# Patient Record
Sex: Female | Born: 1980 | Race: Black or African American | Hispanic: No | Marital: Single | Smoking: Never smoker
Health system: Southern US, Community
[De-identification: ages and names within clinical notes are randomized; demographics above are authoritative.]

## PROBLEM LIST (undated history)

## (undated) DIAGNOSIS — M797 Fibromyalgia: Secondary | ICD-10-CM

## (undated) DIAGNOSIS — I10 Essential (primary) hypertension: Secondary | ICD-10-CM

## (undated) DIAGNOSIS — E119 Type 2 diabetes mellitus without complications: Secondary | ICD-10-CM

---

## 2018-03-19 DIAGNOSIS — L03011 Cellulitis of right finger: Secondary | ICD-10-CM

## 2018-03-19 DIAGNOSIS — I639 Cerebral infarction, unspecified: Secondary | ICD-10-CM

## 2018-03-20 DIAGNOSIS — I639 Cerebral infarction, unspecified: Secondary | ICD-10-CM | POA: Diagnosis not present

## 2018-03-20 DIAGNOSIS — L03011 Cellulitis of right finger: Secondary | ICD-10-CM | POA: Diagnosis not present

## 2018-03-20 DIAGNOSIS — I6789 Other cerebrovascular disease: Secondary | ICD-10-CM

## 2018-03-31 ENCOUNTER — Other Ambulatory Visit: Payer: Self-pay

## 2018-03-31 ENCOUNTER — Encounter (HOSPITAL_COMMUNITY): Payer: Self-pay | Admitting: General Practice

## 2018-03-31 NOTE — Progress Notes (Signed)
Pre-op call completed.  Patient instructed to take Amlodipine day of surgery and to check blood sugars first thing in the morning and then every 2 hours.  Patient instructed to drink 1/2 cup of apple or cranberry juice if blood sugar drops below 70 and then to recheck after 15 minutes.  Patient verbalized understanding.  Patient instructed to have nothing to eat or drink after midnight. Patient states that her fasting glucose is 104-117.    Patient denied being under the care of a cardiologist.  PCP - Dr. Modesto Charonhomas White  Patient states that she had labs and EKG at [facility] on [facility] 6[facility].  Requested records.

## 2018-03-31 NOTE — H&P (Signed)
  Ariel Good is an 37 y.o. female.   Chief Complaint: RIGHT THUMB MASS  HPI: Ariel Good is a 37 y/o right hand dominant female who has had a mass develop on the thumb with no known injury. The mass has been there for approximately 3 weeks.  The mass is painful and has caused her to call out of work several times.  She was seen in our office for evaluation where we discussed the reason and rationale for surgical intervention.  She is here today for surgery.  She denies any chest pain, shortness of breath, nausea, vomiting, diarrhea, fever, or chills.    No past medical history on file.   No family history on file. Social History:  has no tobacco, alcohol, and drug history on file.  Allergies:  Allergies  Allergen Reactions  . Penicillins Hives and Swelling    Has patient had a PCN reaction causing immediate rash, facial/tongue/throat swelling, SOB or lightheadedness with hypotension:# #  Yes # # Has patient had a PCN reaction causing severe rash involving mucus membranes or skin necrosis: No Has patient had a PCN reaction that required hospitalization: No Has patient had a PCN reaction occurring within the last 10 years: No If all of the above answers are "NO", then may proceed with Cephalosporin use.   . Sulfa Antibiotics Hives and Swelling    No medications prior to admission.    No results found for this or any previous visit (from the past 48 hour(s)). No results found.  Review of Systems  Respiratory: Positive for stridor.    NO RECENT ILLNESSES OR HOSPITALIZATIONS  There were no vitals taken for this visit. Physical Exam  General Appearance:  Alert, cooperative, no distress, appears stated age  Head:  Normocephalic, without obvious abnormality, atraumatic  Eyes:  Pupils equal, conjunctiva/corneas clear,         Throat: Lips, mucosa, and tongue normal; teeth and gums normal  Neck: No visible masses     Lungs:   respirations unlabored  Chest Wall:  No tenderness or  deformity  Heart:  Regular rate and rhythm,  Abdomen:   Soft, non-tender,         Extremities: RUE: skin intact fingers warm well perfused Able to extend thumb and digits Able to flex thumb ip joint  Pulses: 2+ and symmetric  Skin: Skin color, texture, turgor normal, no rashes or lesions     Neurologic: Normal    Assessment RIGHT THUMB MASS   Plan RIGHT THUMB AND HAND EXPLORATION AND MASS REMOVAL  R/B/A DISCUSSED WITH PT IN OFFICE.  PT VOICED UNDERSTANDING OF PLAN CONSENT SIGNED DAY OF SURGERY PT SEEN AND EXAMINED PRIOR TO OPERATIVE PROCEDURE/DAY OF SURGERY SITE MARKED. QUESTIONS ANSWERED WILL GO HOME FOLLOWING SURGERY  WE ARE PLANNING SURGERY FOR YOUR UPPER EXTREMITY. THE RISKS AND BENEFITS OF SURGERY INCLUDE BUT NOT LIMITED TO BLEEDING INFECTION, DAMAGE TO NEARBY NERVES ARTERIES TENDONS, FAILURE OF SURGERY TO ACCOMPLISH ITS INTENDED GOALS, PERSISTENT SYMPTOMS AND NEED FOR FURTHER SURGICAL INTERVENTION. WITH THIS IN MIND WE WILL PROCEED. I HAVE DISCUSSED WITH THE PATIENT THE PRE AND POSTOPERATIVE REGIMEN AND THE DOS AND DON'TS. PT VOICED UNDERSTANDING AND INFORMED CONSENT SIGNED.  Bradly BienenstockFred Lincoln Kleiner, MD Lelon MastSamantha Daiva NakayamaBonham Barton 03/31/2018, 11:53 AM

## 2018-04-01 ENCOUNTER — Ambulatory Visit (HOSPITAL_COMMUNITY): Payer: No Typology Code available for payment source | Admitting: Certified Registered"

## 2018-04-01 ENCOUNTER — Encounter (HOSPITAL_COMMUNITY): Payer: Self-pay | Admitting: General Practice

## 2018-04-01 ENCOUNTER — Other Ambulatory Visit: Payer: Self-pay

## 2018-04-01 ENCOUNTER — Encounter (HOSPITAL_COMMUNITY)
Admission: RE | Disposition: A | Discharge status: Home / Self Care | Payer: Self-pay | Source: Ambulatory Visit | Attending: Orthopedic Surgery

## 2018-04-01 ENCOUNTER — Ambulatory Visit (HOSPITAL_COMMUNITY)
Admission: RE | Admit: 2018-04-01 | Discharge: 2018-04-01 | Disposition: A | Discharge status: Home / Self Care | Payer: No Typology Code available for payment source | Source: Ambulatory Visit | Attending: Orthopedic Surgery | Admitting: Orthopedic Surgery

## 2018-04-01 DIAGNOSIS — R2231 Localized swelling, mass and lump, right upper limb: Secondary | ICD-10-CM | POA: Insufficient documentation

## 2018-04-01 DIAGNOSIS — Z882 Allergy status to sulfonamides status: Secondary | ICD-10-CM | POA: Diagnosis not present

## 2018-04-01 DIAGNOSIS — Z79899 Other long term (current) drug therapy: Secondary | ICD-10-CM | POA: Insufficient documentation

## 2018-04-01 DIAGNOSIS — M797 Fibromyalgia: Secondary | ICD-10-CM | POA: Insufficient documentation

## 2018-04-01 DIAGNOSIS — M899 Disorder of bone, unspecified: Secondary | ICD-10-CM | POA: Insufficient documentation

## 2018-04-01 DIAGNOSIS — Z7984 Long term (current) use of oral hypoglycemic drugs: Secondary | ICD-10-CM | POA: Insufficient documentation

## 2018-04-01 DIAGNOSIS — Z880 Allergy status to penicillin: Secondary | ICD-10-CM | POA: Insufficient documentation

## 2018-04-01 DIAGNOSIS — M65841 Other synovitis and tenosynovitis, right hand: Secondary | ICD-10-CM | POA: Insufficient documentation

## 2018-04-01 DIAGNOSIS — I10 Essential (primary) hypertension: Secondary | ICD-10-CM | POA: Diagnosis not present

## 2018-04-01 DIAGNOSIS — M25841 Other specified joint disorders, right hand: Secondary | ICD-10-CM

## 2018-04-01 DIAGNOSIS — E119 Type 2 diabetes mellitus without complications: Secondary | ICD-10-CM | POA: Insufficient documentation

## 2018-04-01 HISTORY — DX: Essential (primary) hypertension: I10

## 2018-04-01 HISTORY — PX: MASS EXCISION: SHX2000

## 2018-04-01 HISTORY — DX: Type 2 diabetes mellitus without complications: E11.9

## 2018-04-01 HISTORY — DX: Fibromyalgia: M79.7

## 2018-04-01 LAB — POCT PREGNANCY, URINE: Preg Test, Ur: NEGATIVE

## 2018-04-01 LAB — GLUCOSE, CAPILLARY
GLUCOSE-CAPILLARY: 125 mg/dL — AB (ref 65–99)
GLUCOSE-CAPILLARY: 131 mg/dL — AB (ref 65–99)

## 2018-04-01 SURGERY — EXCISION MASS
Anesthesia: General | Site: Hand | Laterality: Right

## 2018-04-01 MED ORDER — ONDANSETRON HCL 4 MG/2ML IJ SOLN: 4.0000 mg | Freq: Four times a day (QID) | INTRAMUSCULAR | Status: DC | PRN

## 2018-04-01 MED ORDER — BUPIVACAINE HCL (PF) 0.25 % IJ SOLN
INTRAMUSCULAR | Status: DC | PRN
  Administered 2018-04-01: 5 mL

## 2018-04-01 MED ORDER — SCOPOLAMINE 1 MG/3DAYS TD PT72
MEDICATED_PATCH | TRANSDERMAL | Status: AC
  Filled 2018-04-01: qty 1

## 2018-04-01 MED ORDER — BUPIVACAINE HCL (PF) 0.25 % IJ SOLN
INTRAMUSCULAR | Status: AC
  Filled 2018-04-01: qty 30

## 2018-04-01 MED ORDER — CHLORHEXIDINE GLUCONATE 4 % EX LIQD: 60.0000 mL | Freq: Once | CUTANEOUS | Status: DC

## 2018-04-01 MED ORDER — FENTANYL CITRATE (PF) 100 MCG/2ML IJ SOLN: 25.0000 ug | INTRAMUSCULAR | Status: DC | PRN

## 2018-04-01 MED ORDER — OXYCODONE HCL 5 MG PO TABS: 5.0000 mg | ORAL_TABLET | Freq: Once | ORAL | Status: DC | PRN

## 2018-04-01 MED ORDER — CLINDAMYCIN PHOSPHATE 900 MG/50ML IV SOLN
INTRAVENOUS | Status: AC
  Filled 2018-04-01: qty 50

## 2018-04-01 MED ORDER — FENTANYL CITRATE (PF) 100 MCG/2ML IJ SOLN
INTRAMUSCULAR | Status: DC | PRN
  Administered 2018-04-01 (×2): 50 ug via INTRAVENOUS
  Administered 2018-04-01: 25 ug via INTRAVENOUS

## 2018-04-01 MED ORDER — CLINDAMYCIN PHOSPHATE 900 MG/50ML IV SOLN
900.0000 mg | INTRAVENOUS | Status: AC
  Administered 2018-04-01: 900 mg via INTRAVENOUS

## 2018-04-01 MED ORDER — PROPOFOL 10 MG/ML IV BOLUS
INTRAVENOUS | Status: AC
  Filled 2018-04-01: qty 20

## 2018-04-01 MED ORDER — MIDAZOLAM HCL 2 MG/2ML IJ SOLN
INTRAMUSCULAR | Status: AC
  Filled 2018-04-01: qty 2

## 2018-04-01 MED ORDER — FENTANYL CITRATE (PF) 250 MCG/5ML IJ SOLN
INTRAMUSCULAR | Status: AC
  Filled 2018-04-01: qty 5

## 2018-04-01 MED ORDER — LACTATED RINGERS IV SOLN
INTRAVENOUS | Status: DC
  Administered 2018-04-01 (×2): via INTRAVENOUS

## 2018-04-01 MED ORDER — LIDOCAINE 2% (20 MG/ML) 5 ML SYRINGE
INTRAMUSCULAR | Status: AC
  Filled 2018-04-01: qty 5

## 2018-04-01 MED ORDER — BUPIVACAINE-EPINEPHRINE (PF) 0.5% -1:200000 IJ SOLN
INTRAMUSCULAR | Status: DC | PRN
  Administered 2018-04-01: 30 mL via PERINEURAL

## 2018-04-01 MED ORDER — OXYCODONE HCL 5 MG/5ML PO SOLN: 5.0000 mg | Freq: Once | ORAL | Status: DC | PRN

## 2018-04-01 MED ORDER — MIDAZOLAM HCL 5 MG/5ML IJ SOLN
INTRAMUSCULAR | Status: DC | PRN
  Administered 2018-04-01: 2 mg via INTRAVENOUS

## 2018-04-01 MED ORDER — LIDOCAINE 2% (20 MG/ML) 5 ML SYRINGE
INTRAMUSCULAR | Status: DC | PRN
  Administered 2018-04-01: 60 mg via INTRAVENOUS

## 2018-04-01 MED ORDER — PROPOFOL 500 MG/50ML IV EMUL
INTRAVENOUS | Status: DC | PRN
  Administered 2018-04-01: 75 ug/kg/min via INTRAVENOUS

## 2018-04-01 SURGICAL SUPPLY — 47 items
BANDAGE ACE 3X5.8 VEL STRL LF (GAUZE/BANDAGES/DRESSINGS) ×3 IMPLANT
BANDAGE ACE 4X5 VEL STRL LF (GAUZE/BANDAGES/DRESSINGS) IMPLANT
BANDAGE ELASTIC 4 VELCRO ST LF (GAUZE/BANDAGES/DRESSINGS) ×3 IMPLANT
BNDG ELASTIC 2X5.8 VLCR STR LF (GAUZE/BANDAGES/DRESSINGS) ×3 IMPLANT
BNDG GAUZE ELAST 4 BULKY (GAUZE/BANDAGES/DRESSINGS) ×3 IMPLANT
CORDS BIPOLAR (ELECTRODE) ×3 IMPLANT
COVER SURGICAL LIGHT HANDLE (MISCELLANEOUS) ×3 IMPLANT
CUFF TOURNIQUET SINGLE 18IN (TOURNIQUET CUFF) ×3 IMPLANT
CUFF TOURNIQUET SINGLE 24IN (TOURNIQUET CUFF) IMPLANT
DRAPE C-ARM MINI 42X72 WSTRAPS (DRAPES) ×3 IMPLANT
DRAPE SURG 17X23 STRL (DRAPES) ×3 IMPLANT
DRSG ADAPTIC 3X8 NADH LF (GAUZE/BANDAGES/DRESSINGS) ×3 IMPLANT
GAUZE SPONGE 4X4 12PLY STRL (GAUZE/BANDAGES/DRESSINGS) IMPLANT
GLOVE BIOGEL PI IND STRL 8.5 (GLOVE) ×1 IMPLANT
GLOVE BIOGEL PI INDICATOR 8.5 (GLOVE) ×2
GLOVE SURG ORTHO 8.0 STRL STRW (GLOVE) ×3 IMPLANT
GOWN STRL REUS W/ TWL LRG LVL3 (GOWN DISPOSABLE) ×2 IMPLANT
GOWN STRL REUS W/ TWL XL LVL3 (GOWN DISPOSABLE) ×1 IMPLANT
GOWN STRL REUS W/TWL LRG LVL3 (GOWN DISPOSABLE) ×4
GOWN STRL REUS W/TWL XL LVL3 (GOWN DISPOSABLE) ×2
KIT BASIN OR (CUSTOM PROCEDURE TRAY) ×3 IMPLANT
KIT TURNOVER KIT B (KITS) ×3 IMPLANT
MANIFOLD NEPTUNE II (INSTRUMENTS) IMPLANT
NEEDLE HYPO 25GX1X1/2 BEV (NEEDLE) ×3 IMPLANT
NS IRRIG 1000ML POUR BTL (IV SOLUTION) ×3 IMPLANT
PACK ORTHO EXTREMITY (CUSTOM PROCEDURE TRAY) ×3 IMPLANT
PAD ARMBOARD 7.5X6 YLW CONV (MISCELLANEOUS) ×6 IMPLANT
PAD CAST 4YDX4 CTTN HI CHSV (CAST SUPPLIES) IMPLANT
PADDING CAST COTTON 4X4 STRL (CAST SUPPLIES)
SOAP 2 % CHG 4 OZ (WOUND CARE) ×3 IMPLANT
SPECIMEN JAR SMALL (MISCELLANEOUS) ×3 IMPLANT
SUCTION FRAZIER HANDLE 10FR (MISCELLANEOUS)
SUCTION TUBE FRAZIER 10FR DISP (MISCELLANEOUS) IMPLANT
SUT MERSILENE 4 0 P 3 (SUTURE) IMPLANT
SUT MNCRL AB 3-0 PS2 18 (SUTURE) ×3 IMPLANT
SUT MNCRL AB 4-0 PS2 18 (SUTURE) ×3 IMPLANT
SUT PROLENE 3 0 PS 2 (SUTURE) ×3 IMPLANT
SUT PROLENE 4 0 PS 2 18 (SUTURE) IMPLANT
SUT VIC AB 2-0 CT1 27 (SUTURE)
SUT VIC AB 2-0 CT1 TAPERPNT 27 (SUTURE) IMPLANT
SYR CONTROL 10ML LL (SYRINGE) ×3 IMPLANT
TOWEL OR 17X24 6PK STRL BLUE (TOWEL DISPOSABLE) ×3 IMPLANT
TOWEL OR 17X26 10 PK STRL BLUE (TOWEL DISPOSABLE) ×3 IMPLANT
TUBE CONNECTING 12'X1/4 (SUCTIONS)
TUBE CONNECTING 12X1/4 (SUCTIONS) IMPLANT
UNDERPAD 30X30 (UNDERPADS AND DIAPERS) ×3 IMPLANT
WATER STERILE IRR 1000ML POUR (IV SOLUTION) ×3 IMPLANT

## 2018-04-01 NOTE — Anesthesia Preprocedure Evaluation (Signed)
Anesthesia Evaluation  Patient identified by MRN, date of birth, ID band Patient awake    Reviewed: Allergy & Precautions, H&P , NPO status , Patient's Chart, lab work & pertinent test results  Airway Mallampati: II   Neck ROM: full    Dental   Pulmonary neg pulmonary ROS,    breath sounds clear to auscultation       Cardiovascular hypertension,  Rhythm:regular Rate:Normal     Neuro/Psych  Neuromuscular disease    GI/Hepatic   Endo/Other  diabetes, Type 2  Renal/GU      Musculoskeletal  (+) Fibromyalgia -  Abdominal   Peds  Hematology   Anesthesia Other Findings   Reproductive/Obstetrics                             Anesthesia Physical Anesthesia Plan  ASA: II  Anesthesia Plan: General   Post-op Pain Management:    Induction: Intravenous  PONV Risk Score and Plan: 3 and Ondansetron, Dexamethasone, Midazolam and Treatment may vary due to age or medical condition  Airway Management Planned: LMA  Additional Equipment:   Intra-op Plan:   Post-operative Plan:   Informed Consent: I have reviewed the patients History and Physical, chart, labs and discussed the procedure including the risks, benefits and alternatives for the proposed anesthesia with the patient or authorized representative who has indicated his/her understanding and acceptance.     Plan Discussed with: CRNA, Anesthesiologist and Surgeon  Anesthesia Plan Comments:         Anesthesia Quick Evaluation

## 2018-04-01 NOTE — Anesthesia Procedure Notes (Signed)
Anesthesia Regional Block: Supraclavicular block   Pre-Anesthetic Checklist: ,, timeout performed, Correct Patient, Correct Site, Correct Laterality, Correct Procedure, Correct Position, site marked, Risks and benefits discussed,  Surgical consent,  Pre-op evaluation,  At surgeon's request and post-op pain management  Laterality: Right  Prep: chloraprep       Needles:  Injection technique: Single-shot  Needle Type: Echogenic Stimulator Needle     Needle Length: 5cm  Needle Gauge: 22     Additional Needles:   Procedures:, nerve stimulator,,,,,,,   Nerve Stimulator or Paresthesia:  Response: biceps flexion, 0.45 mA,   Additional Responses:   Narrative:  Start time: 04/01/2018 4:20 PM End time: 04/01/2018 4:25 PM Injection made incrementally with aspirations every 5 mL.  Performed by: Personally  Anesthesiologist: Achille RichHodierne, Jerlean Peralta, MD  Additional Notes: Functioning IV was confirmed and monitors were applied.  A 50mm 22ga Arrow echogenic stimulator needle was used. Sterile prep and drape,hand hygiene and sterile gloves were used.  Negative aspiration and negative test dose prior to incremental administration of local anesthetic. The patient tolerated the procedure well.  Ultrasound guidance: relevent anatomy identified, needle position confirmed, local anesthetic spread visualized around nerve(s), vascular puncture avoided.  Image printed for medical record.

## 2018-04-01 NOTE — Transfer of Care (Signed)
Immediate Anesthesia Transfer of Care Note  Patient: Ariel Good  Procedure(s) Performed: RIGHT THUMB AND HAND EXPLORATION AND MASS EXCISION (Right Hand)  Patient Location: PACU  Anesthesia Type:MAC combined with regional for post-op pain  Level of Consciousness: awake, alert  and oriented  Airway & Oxygen Therapy: Patient Spontanous Breathing  Post-op Assessment: Report given to RN and Post -op Vital signs reviewed and stable  Post vital signs: Reviewed and stable  Last Vitals:  Vitals Value Taken Time  BP 109/81 04/01/2018  6:01 PM  Temp    Pulse 105 04/01/2018  6:04 PM  Resp 20 04/01/2018  6:04 PM  SpO2 97 % 04/01/2018  6:04 PM  Vitals shown include unvalidated device data.  Last Pain:  Vitals:   04/01/18 1611  TempSrc: Oral  PainSc:       Patients Stated Pain Goal: 2 (76/15/18 3437)  Complications: No apparent anesthesia complications

## 2018-04-01 NOTE — Discharge Instructions (Signed)
KEEP BANDAGE CLEAN AND DRY CALL OFFICE FOR F/U APPT [phone] in 10 days Rx sent to [facility] in [place] KEEP HAND ELEVATED ABOVE HEART OK TO APPLY ICE TO OPERATIVE AREA CONTACT OFFICE IF ANY WORSENING PAIN OR CONCERNS.

## 2018-04-01 NOTE — Progress Notes (Signed)
Orthopedic Tech Progress Note Patient Details:  Ariel Good 09/05/1981 [id number]  Ortho Devices Type of Ortho Device: Arm sling Ortho Device/Splint Location: RUE Ortho Device/Splint Interventions: Ordered, Application   Post Interventions Patient Tolerated: Well Instructions Provided: Care of device   Jennye MoccasinHughes, Erendira Crabtree Craig 04/01/2018, 6:43 PM

## 2018-04-01 NOTE — Op Note (Signed)
PREOPERATIVE DIAGNOSIS: Right thumb mass  POSTOPERATIVE DIAGNOSIS: Same  ATTENDING SURGEON: Dr. Bradly BienenstockFred Gilman Olazabal who was scrubbed and present for the entire procedure  ASSISTANT SURGEON: Lambert ModySamantha Barton PA-C was scrubbed and necessary for exposure mass removal closure and splinting  ANESTHESIA: Regional block with IV sedation  OPERATIVE PROCEDURE: #1: Partial excision of bone proximal phalanx of the thumb, right  #2: Right thumb metacarpophalangeal joint synovectomy  IMPLANTS: None  RADIOGRAPHIC INTERPRETATION: AP lateral oblique views of the thumb do show good preservation the joint interval  SURGICAL INDICATIONS: Patient is a right-hand-dominant female with persistent pain over the volar radial ulnar aspect of the thumb. Patient elected undergo the above procedure. Risks benefits and alternatives were discussed in detail with the patient and signed informed consent was obtained. Risks include but not limited to bleeding infection damage to nearby nerves arteries or tendons loss of motion of the wrists and digits incomplete relief of symptoms and need for further surgical intervention  SURGICAL TECHNIQUE: Patient is probably identified in the preoperative holding area marked for permanent marker made on the right thumb indicate the correct operative site. Patient brought back Room placed supine on anesthesia and table where general no regional anesthetic had been administered. IV sedation was administered. A well-padded tourniquet was then placed on the right brachium and sealed with the appropriate drape. The right upper extremities and prepped and draped in normal sterile fashion. Timeout was called the correct site was identified and the procedure then begun. A curvilinear incision made directly over the ulnar aspect of the thumb directly over the MCP joint. Careful protection of the distal branches of the radial sensory nerve were then done. Following this deep dissection carried down to the  proximal phalanx. The abductor aponeurosis was then opened up. Careful protection of the ulnar collateral ligament was then done. The bony lesion along the proximal phalanx was then removed partial excision of portion the proximal phalanx was then done. The wound was then thoroughly irrigated. Dorsally a small joint capsulotomy was then created. The joint was then exposed patient did have a mild synovitis and synovectomy was then carried out within the joint. The joint was then thoroughly irrigated. The joint was then closed with 3-0 Monocryl suture. The abductor aponeurosis is then closed with 3-0 Monocryl suture. Subcutaneous tissues closed with 4-0 Monocryl and skin closed with 4-0 Prolene. Adaptic dressing and a sterile compressive bandage then applied. The patient was placed in well-padded thumb spica splint. Patient tolerated the procedure well.  POSTOPERATIVE PLAN: Patient be discharged to home. Seen back in the office in 10-12 days for wound check suture removal go over the pathology then gradual use and activity. Transition back in a thumb spica splint.

## 2018-04-02 ENCOUNTER — Encounter (HOSPITAL_COMMUNITY): Payer: Self-pay | Admitting: Orthopedic Surgery

## 2018-04-02 NOTE — Anesthesia Postprocedure Evaluation (Signed)
Anesthesia Post Note  Patient: Ariel Good  Procedure(s) Performed: RIGHT THUMB AND HAND EXPLORATION AND MASS EXCISION (Right Hand)     Patient location during evaluation: PACU Anesthesia Type: MAC and Regional Level of consciousness: awake and alert Pain management: pain level controlled Vital Signs Assessment: post-procedure vital signs reviewed and stable Respiratory status: spontaneous breathing, nonlabored ventilation, respiratory function stable and patient connected to nasal cannula oxygen Cardiovascular status: stable and blood pressure returned to baseline Postop Assessment: no apparent nausea or vomiting Anesthetic complications: no    Last Vitals:  Vitals:   04/01/18 1800 04/01/18 1815  BP: 109/81 119/80  Pulse: (!) 106 92  Resp: (!) 23 18  Temp: (!) 36.3 C   SpO2: 99% 97%    Last Pain:  Vitals:   04/01/18 1815  TempSrc:   PainSc: Asleep                 Fordyce Lepak S

## 2018-04-17 ENCOUNTER — Other Ambulatory Visit: Payer: Self-pay

## 2018-04-17 ENCOUNTER — Emergency Department (HOSPITAL_COMMUNITY)
Admission: EM | Admit: 2018-04-17 | Discharge: 2018-04-17 | Disposition: A | Discharge status: Home / Self Care | Payer: 59 | Attending: Emergency Medicine | Admitting: Emergency Medicine

## 2018-04-17 ENCOUNTER — Encounter (HOSPITAL_COMMUNITY): Payer: Self-pay | Admitting: Emergency Medicine

## 2018-04-17 DIAGNOSIS — E119 Type 2 diabetes mellitus without complications: Secondary | ICD-10-CM | POA: Diagnosis not present

## 2018-04-17 DIAGNOSIS — R479 Unspecified speech disturbances: Secondary | ICD-10-CM

## 2018-04-17 DIAGNOSIS — R262 Difficulty in walking, not elsewhere classified: Secondary | ICD-10-CM | POA: Diagnosis not present

## 2018-04-17 DIAGNOSIS — G43909 Migraine, unspecified, not intractable, without status migrainosus: Secondary | ICD-10-CM | POA: Diagnosis not present

## 2018-04-17 DIAGNOSIS — Z79899 Other long term (current) drug therapy: Secondary | ICD-10-CM | POA: Diagnosis not present

## 2018-04-17 DIAGNOSIS — R4702 Dysphasia: Secondary | ICD-10-CM | POA: Insufficient documentation

## 2018-04-17 DIAGNOSIS — I10 Essential (primary) hypertension: Secondary | ICD-10-CM | POA: Insufficient documentation

## 2018-04-17 DIAGNOSIS — R51 Headache: Secondary | ICD-10-CM | POA: Diagnosis present

## 2018-04-17 DIAGNOSIS — Z7984 Long term (current) use of oral hypoglycemic drugs: Secondary | ICD-10-CM | POA: Insufficient documentation

## 2018-04-17 MED ORDER — BUTALBITAL-APAP-CAFFEINE 50-325-40 MG PO TABS
1.0000 | ORAL_TABLET | Freq: Once | ORAL | Status: DC
  Filled 2018-04-17: qty 1

## 2018-04-17 MED ORDER — PROCHLORPERAZINE EDISYLATE 10 MG/2ML IJ SOLN
10.0000 mg | Freq: Once | INTRAMUSCULAR | Status: AC
  Administered 2018-04-17: 10 mg via INTRAVENOUS
  Filled 2018-04-17: qty 2

## 2018-04-17 MED ORDER — DIPHENHYDRAMINE HCL 50 MG/ML IJ SOLN
12.5000 mg | Freq: Once | INTRAMUSCULAR | Status: AC
  Administered 2018-04-17: 12.5 mg via INTRAVENOUS
  Filled 2018-04-17: qty 1

## 2018-04-17 MED ORDER — LORAZEPAM 2 MG/ML IJ SOLN
1.0000 mg | Freq: Once | INTRAMUSCULAR | Status: AC
  Administered 2018-04-17: 1 mg via INTRAVENOUS
  Filled 2018-04-17: qty 1

## 2018-04-17 NOTE — ED Provider Notes (Signed)
[facility] EMERGENCY [facility] Provider Note   CSN: [id number] Arrival date & time: 04/17/18  1524     History   Chief Complaint Chief Complaint  Patient presents with  . Migraine    HPI Ariel Good is a 37 y.o. female.  HPI: Patient has a history of DM and HTN and presents to the ED with frontal headache, body tremors, difficulty walking, difficulty speaking, and difficulty swallowing. She has been having episodes with these symptoms for about two weeks. The episodes have increased in frequency and she has had 5-6 episodes already today. They seem to be triggered by pain or stress, and they began shortly after having sutures in her right hand removed today. She was worked up for similar symptoms last month with concern for stroke. Head CT and brain MRI were negative. 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 Today her headache is frontal, throbbing, and associated with photophobia, phonophobia, and nausea. She was recently started on Topamax which has decreased the severity of her headaches, but not the frequency. She was also started on sumatriptan for headache rescue, but has not taken that today.   Past Medical History:  Diagnosis Date  . Diabetes mellitus without complication (HCC)   . Fibromyalgia   . Hypertension     There are no active problems to display for this patient.   Past Surgical History:  Procedure Laterality Date  . MASS EXCISION Right 04/01/2018   Procedure: RIGHT THUMB AND HAND EXPLORATION AND MASS EXCISION;  Surgeon: Bradly Bienenstockrtmann, Fred, MD;  Location: [facility] OR;  Service: Orthopedics;  Laterality: Right;     OB History    Gravida  1   Para      Term      Preterm      AB      Living        SAB      TAB      Ectopic      Multiple      Live Births               Home Medications    Prior to Admission medications   Medication Sig Start Date End Date  Taking? Authorizing Provider  amLODipine (NORVASC) 5 MG tablet Take 5 mg by mouth daily.    [provider]  lisinopril-hydrochlorothiazide (PRINZIDE,ZESTORETIC) 10-12.5 MG tablet Take 1 tablet by mouth daily.    [provider]  metFORMIN (GLUCOPHAGE) 500 MG tablet Take 2,000 mg by mouth 2 (two) times daily with a meal.    [provider]    Family History No family history on file.  Social History Social History   Tobacco Use  . Smoking status: Never Smoker  . Smokeless tobacco: Never Used  Substance Use Topics  . Alcohol use: Never    Frequency: Never  . Drug use: Never     Allergies   Penicillins and Sulfa antibiotics   Review of Systems Review of Systems  Constitutional: Negative for chills and fever.  HENT: Positive for trouble swallowing.   Eyes: Positive for photophobia. Negative for visual disturbance.  Respiratory: Negative for cough and shortness of breath.   Cardiovascular: Negative for chest pain.  Gastrointestinal: Negative for abdominal distention.  Musculoskeletal: Positive for neck stiffness.  Skin: Negative for rash and wound.  Neurological: Positive for tremors, speech difficulty, weakness and headaches.       Difficulty walking     Physical  Exam Updated Vital Signs LMP 03/23/2018 (Exact Date)   Physical Exam  Constitutional: She appears well-developed and well-nourished. No distress.  HENT:  Head: Normocephalic.  Eyes: Pupils are equal, round, and reactive to light. EOM are normal.  Neck: Normal range of motion. Neck supple.  Cardiovascular: Normal rate and regular rhythm. Exam reveals no gallop and no friction rub.  No murmur heard. Pulmonary/Chest: Effort normal and breath sounds normal. She has no wheezes. She has no rales.  Abdominal: Soft. She exhibits no distension. There is no tenderness.  Musculoskeletal: She exhibits no edema or deformity.  Neurological: She is alert. No cranial nerve deficit or sensory  deficit.  Garbled speech with difficulty understanding patient's words. She does not seem to be having trouble finding her words, but her speech is incomprehensible. 4/5 strength in all 4 extremities. Abnormal gait with slow, almost robotic movements.  Skin: Skin is warm and dry.  Psychiatric:  Difficult to assess due to garbled speech     ED Treatments / Results  Labs (all labs ordered are listed, but only abnormal results are displayed) Labs Reviewed - No data to display  EKG None  Radiology No results found.  Procedures Procedures (including critical care time)  Medications Ordered in ED Medications - No data to display   Initial Impression / Assessment and Plan / ED Course  I have reviewed the triage vital signs and the nursing notes.  Pertinent labs & imaging results that were available during my care of the patient were reviewed by me and considered in my medical decision making (see chart for details).  Ms. Rys is a 37 yo female with a history of DM and HTN who presents to the ED with recurrent episodes of frontal headache, body tremors, difficulty walking, difficulty speaking, and difficulty swallowing that are triggered by stress/pain and have increased in frequency over the past 2 weeks. Upon arrival to the ED, patient is afebrile and hemodynamically stable. Physical exam was significant for garbled speech and abnormal gait. She was treated with compazine, benadryl, and ativan for her headache, nausea, and anxiety. Upon reassessment, the patient reported decreased headache and nausea and was resting comfortably. The patient has recently been evaluated for these same episodes with head CT and MRI, which were negative. Her presenting symptoms are likely due to conversion disorder or another functional neurological disorder. The patient is deemed safe for discharge home. She was educated about return precautions including fever, neck stiffness, vision loss, and paralysis. She  was advised to f/u with her neurologist as soon as she can get an appointment.  Final Clinical Impressions(s) / ED Diagnoses   Final diagnoses:  None    ED Discharge Orders    None       Sherri Mcarthy, Cathleen Corti, MD 04/17/18 0981    Lorre Nick, MD 04/19/18 1551

## 2018-04-17 NOTE — ED Notes (Signed)
Pt declined fioricet as ordered, she states "Im not taking anymore medicine, you have already given me too much."  EDP made aware

## 2018-04-17 NOTE — Discharge Instructions (Addendum)
You have been treated for a migraine headache. Please return to the ED with fever, rigid neck, loss of vision, or paralysis. Please continue to take your Topamax and Sumatriptan as prescribed by your PCP for headaches. 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

## 2018-04-17 NOTE — ED Notes (Signed)
Assisted pt to the BR, pt ambulated - she appeared to be dragging her legs and feet and appeared to be in pain.  When she returned in the bed, she was able to pick her legs up on the bed to lay down.  Then she stated she was getting ready to have another "attack", her body became tense like she was having a seizure, pt was grimacing and grunting.  After the attack, pt is alert and was able to answer questions appropriately but with slurred speech still.

## 2018-04-17 NOTE — ED Triage Notes (Signed)
Patient to ED with friend, who states patient just had an "attack," in which started out as a migraine accompanied by muscles tensing up, nausea, speech changes (stuttering). This is a recurring problem - [place] calling these "attacks" part of a functional neurological disorder. Patient endorses headache rated 5/10, is stuttering in triage, and appears to be having difficulty moving her body. She is A&O x 4.

## 2018-04-17 NOTE — ED Provider Notes (Signed)
Patient placed in Quick Look pathway, seen and evaluated   Chief Complaint: headache, body shakes, stuttering  HPI:   Ariel Good is a 37 y.o. female who presents to the ED with her friend for change in speech, headache and body shakes. Patient's friend reports that the patient has been having similar episodes off and on for the past 3 weeks. 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 Patient went to [facility] ED before going to her PCP and they did a CT of her head and told her it was normal. Patient had surgery on right arm 2 weeks ago for mass in her her arm. She went today for suture removal and had more episodes.   ROS: Neuro: headache, slurred speech  GI: nausea and vomiting    Physical Exam:   Gen: patient appears uncomfortable   Skin: Warm and dry  Neuro: patient jerking and shaking      Initiation of care has begun. The patient has been counseled on the process, plan, and necessity for staying for the completion/evaluation, and the remainder of the medical screening examination    Ariel Good, Ariel Good, Ariel Good 04/17/18 1541    Ariel Good, Joshua, Ariel Good 04/18/18 0003

## 2018-04-17 NOTE — ED Provider Notes (Signed)
I saw and evaluated the patient, reviewed the resident's note and I agree with the findings and plan.  EKG: EKG Interpretation  Date/Time:  Friday April 17 2018 15:33:27 EDT Ventricular Rate:  96 PR Interval:  138 QRS Duration: 88 QT Interval:  358 QTC Calculation: 452 R Axis:   70 Text Interpretation:  Normal sinus rhythm Normal ECG Confirmed by Lorre NickAllen, Shareka Casale ([id number]) on 04/17/2018 4:2653:[id number] PM  37 year old female here complaining of multiple episodes of migraines.  Old records were reviewed and patient diagnosed in the past with conversion disorder.  She has been followed by her primary care doctor at this time.  Her neurological exam shows no focal deficits.  She is alert and oriented x4.  She has no dysmetria.  Strength is 5 out of 5 bilaterally.  She has no facial symmetry.  Will recommend Fioricet and outpatient neurological evaluation   Lorre NickAllen, Masen Salvas, MD 04/17/18 1911

## 2021-11-19 ENCOUNTER — Encounter (HOSPITAL_COMMUNITY): Payer: Self-pay

## 2021-11-19 ENCOUNTER — Emergency Department (HOSPITAL_COMMUNITY)
Admission: EM | Admit: 2021-11-19 | Discharge: 2021-11-19 | Disposition: A | Discharge status: Home / Self Care | Payer: No Typology Code available for payment source | Attending: Emergency Medicine | Admitting: Emergency Medicine

## 2021-11-19 ENCOUNTER — Emergency Department (HOSPITAL_COMMUNITY): Payer: No Typology Code available for payment source

## 2021-11-19 DIAGNOSIS — I10 Essential (primary) hypertension: Secondary | ICD-10-CM | POA: Insufficient documentation

## 2021-11-19 DIAGNOSIS — G40909 Epilepsy, unspecified, not intractable, without status epilepticus: Secondary | ICD-10-CM | POA: Insufficient documentation

## 2021-11-19 DIAGNOSIS — E119 Type 2 diabetes mellitus without complications: Secondary | ICD-10-CM | POA: Diagnosis not present

## 2021-11-19 DIAGNOSIS — Y92410 Unspecified street and highway as the place of occurrence of the external cause: Secondary | ICD-10-CM | POA: Insufficient documentation

## 2021-11-19 DIAGNOSIS — R0789 Other chest pain: Secondary | ICD-10-CM | POA: Diagnosis not present

## 2021-11-19 DIAGNOSIS — S82002A Unspecified fracture of left patella, initial encounter for closed fracture: Secondary | ICD-10-CM | POA: Insufficient documentation

## 2021-11-19 DIAGNOSIS — S8992XA Unspecified injury of left lower leg, initial encounter: Secondary | ICD-10-CM | POA: Diagnosis present

## 2021-11-19 LAB — COMPREHENSIVE METABOLIC PANEL
ALT: 8 U/L (ref 0–44)
AST: 19 U/L (ref 15–41)
Albumin: 3.5 g/dL (ref 3.5–5.0)
Alkaline Phosphatase: 58 U/L (ref 38–126)
Anion gap: 13 (ref 5–15)
BUN: 12 mg/dL (ref 6–20)
CO2: 22 mmol/L (ref 22–32)
Calcium: 9.4 mg/dL (ref 8.9–10.3)
Chloride: 101 mmol/L (ref 98–111)
Creatinine, Ser: 0.95 mg/dL (ref 0.44–1.00)
GFR, Estimated: >60 mL/min (ref >60)
Glucose, Bld: 156 mg/dL — ABNORMAL HIGH (ref 70–99)
Potassium: 3.9 mmol/L (ref 3.5–5.1)
Sodium: 136 mmol/L (ref 135–145)
Total Bilirubin: 0.7 mg/dL (ref 0.3–1.2)
Total Protein: 7.9 g/dL (ref 6.5–8.1)

## 2021-11-19 LAB — CBC WITH DIFFERENTIAL/PLATELET
Abs Immature Granulocytes: 0.07 10*3/uL (ref 0.00–0.07)
Basophils Absolute: 0.1 10*3/uL (ref 0.0–0.1)
Basophils Relative: 1 %
Eosinophils Absolute: 0 10*3/uL (ref 0.0–0.5)
Eosinophils Relative: 0 %
HCT: 41.2 % (ref 36.0–46.0)
Hemoglobin: 12.8 g/dL (ref 12.0–15.0)
Immature Granulocytes: 1 %
Lymphocytes Relative: 24 %
Lymphs Abs: 2 10*3/uL (ref 0.7–4.0)
MCH: 26.8 pg (ref 26.0–34.0)
MCHC: 31.1 g/dL (ref 30.0–36.0)
MCV: 86.4 fL (ref 80.0–100.0)
Monocytes Absolute: 0.9 10*3/uL (ref 0.1–1.0)
Monocytes Relative: 11 %
Neutro Abs: 5.2 10*3/uL (ref 1.7–7.7)
Neutrophils Relative %: 63 %
Platelets: 302 10*3/uL (ref 150–400)
RBC: 4.77 MIL/uL (ref 3.87–5.11)
RDW: 15.3 % (ref 11.5–15.5)
WBC: 8.2 10*3/uL (ref 4.0–10.5)
nRBC: 0 % (ref 0.0–0.2)

## 2021-11-19 LAB — I-STAT BETA HCG BLOOD, ED (MC, WL, AP ONLY): I-stat hCG, quantitative: <5 m[IU]/mL (ref <5)

## 2021-11-19 MED ORDER — ONDANSETRON 4 MG PO TBDP
4.0000 mg | ORAL_TABLET | Freq: Once | ORAL | Status: AC
  Administered 2021-11-19: 4 mg via ORAL
  Filled 2021-11-19: qty 1

## 2021-11-19 MED ORDER — ACETAMINOPHEN 500 MG PO TABS
1000.0000 mg | ORAL_TABLET | Freq: Once | ORAL | Status: AC
  Administered 2021-11-19: 1000 mg via ORAL
  Filled 2021-11-19: qty 2

## 2021-11-19 NOTE — ED Notes (Signed)
Pt discharged and wheeled out of the ED in a wheel chair without difficulty. 

## 2021-11-19 NOTE — ED Notes (Signed)
Pt returned from xray tearful. She was stating she wants to call her dad. RN located pt cell phone to call dad. Pt changed mind and decided to call pastor Albin Felling. Pt is currently using phone speaking to [place] in tearful voice.
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
## 2022-01-16 DIAGNOSIS — R079 Chest pain, unspecified: Secondary | ICD-10-CM | POA: Diagnosis not present

## 2022-10-12 ENCOUNTER — Emergency Department (HOSPITAL_COMMUNITY)
Admission: EM | Admit: 2022-10-12 | Discharge: 2022-10-12 | Disposition: A | Discharge status: Home / Self Care | Payer: Medicare Other | Attending: Emergency Medicine | Admitting: Emergency Medicine

## 2022-10-12 ENCOUNTER — Emergency Department (HOSPITAL_COMMUNITY): Payer: Medicare Other

## 2022-10-12 ENCOUNTER — Emergency Department (HOSPITAL_BASED_OUTPATIENT_CLINIC_OR_DEPARTMENT_OTHER): Payer: Medicare Other

## 2022-10-12 ENCOUNTER — Encounter (HOSPITAL_COMMUNITY): Payer: Self-pay | Admitting: Emergency Medicine

## 2022-10-12 ENCOUNTER — Other Ambulatory Visit: Payer: Self-pay

## 2022-10-12 DIAGNOSIS — M25562 Pain in left knee: Secondary | ICD-10-CM

## 2022-10-12 DIAGNOSIS — X58XXXD Exposure to other specified factors, subsequent encounter: Secondary | ICD-10-CM | POA: Insufficient documentation

## 2022-10-12 DIAGNOSIS — S89202D Unspecified physeal fracture of upper end of left fibula, subsequent encounter for fracture with routine healing: Secondary | ICD-10-CM | POA: Diagnosis not present

## 2022-10-12 DIAGNOSIS — E119 Type 2 diabetes mellitus without complications: Secondary | ICD-10-CM | POA: Insufficient documentation

## 2022-10-12 DIAGNOSIS — S82832D Other fracture of upper and lower end of left fibula, subsequent encounter for closed fracture with routine healing: Secondary | ICD-10-CM

## 2022-10-12 DIAGNOSIS — Z7984 Long term (current) use of oral hypoglycemic drugs: Secondary | ICD-10-CM | POA: Insufficient documentation

## 2022-10-12 LAB — I-STAT BETA HCG BLOOD, ED (MC, WL, AP ONLY): I-stat hCG, quantitative: <5 m[IU]/mL (ref <5)

## 2022-10-12 MED ORDER — TRAMADOL HCL 50 MG PO TABS: 50.0000 mg | ORAL_TABLET | Freq: Four times a day (QID) | ORAL | 0 refills | Status: AC | PRN

## 2022-10-12 MED ORDER — TRAMADOL HCL 50 MG PO TABS
50.0000 mg | ORAL_TABLET | Freq: Once | ORAL | Status: AC
  Administered 2022-10-12: 50 mg via ORAL
  Filled 2022-10-12: qty 1

## 2022-10-12 NOTE — Discharge Instructions (Signed)
Continue outpatient management with orthopedic.  Take tramadol as needed for breakthrough pain.  This medication is sedating so do not mix with alcohol or drugs or other dangerous activities including driving.

## 2022-10-12 NOTE — ED Notes (Signed)
Patient transported to X-ray 

## 2022-10-12 NOTE — ED Provider Notes (Signed)
[facility] EMERGENCY DEPARTMENT Provider Note   CSN: [id number] Arrival date & time: 10/12/22  1727     History  Chief Complaint  Patient presents with   Pain Management    Ariel Good is a 41 y.o. female.  Patient here with ongoing pain in her left knee after being diagnosed with acute on chronic fracture of her left fibula.  She has been in a knee immobilizer for the last week or so even longer.  She has been taking Tylenol and ibuprofen without much relief.  She is having some pain in her calf.  She denies any fevers or chills.  History of diabetes.  Nothing makes it worse or better.  Denies any new falls.  Here for further pain management.  The history is provided by the patient.       Home Medications Prior to Admission medications   Medication Sig Start Date End Date Taking? Authorizing Provider  traMADol (ULTRAM) 50 MG tablet Take 1 tablet (50 mg total) by mouth every 6 (six) hours as needed. 10/12/22  Yes Dorina Ribaudo, DO  amLODipine (NORVASC) 5 MG tablet Take 5 mg by mouth daily.    [provider]  lisinopril-hydrochlorothiazide (PRINZIDE,ZESTORETIC) 10-12.5 MG tablet Take 1 tablet by mouth daily.    [provider]  metFORMIN (GLUCOPHAGE) 500 MG tablet Take 2,000 mg by mouth 2 (two) times daily with a meal.    [provider]      Allergies    Penicillins and Sulfa antibiotics    Review of Systems   Review of Systems  Physical Exam Updated Vital Signs BP (!) 129/93   Pulse (!) 110   Temp 98.3 F (36.8 C) (Oral)   Resp 16   SpO2 99%  Physical Exam Vitals and nursing note reviewed.  Constitutional:      General: She is not in acute distress.    Appearance: She is well-developed.  HENT:     Head: Normocephalic and atraumatic.     Mouth/Throat:     Mouth: Mucous membranes are moist.  Eyes:     Extraocular Movements: Extraocular movements intact.     Conjunctiva/sclera: Conjunctivae normal.     Pupils:  Pupils are equal, round, and reactive to light.  Cardiovascular:     Rate and Rhythm: Normal rate and regular rhythm.     Pulses: Normal pulses.     Heart sounds: Normal heart sounds. No murmur heard. Pulmonary:     Effort: Pulmonary effort is normal. No respiratory distress.     Breath sounds: Normal breath sounds.  Abdominal:     Palpations: Abdomen is soft.     Tenderness: There is no abdominal tenderness.  Musculoskeletal:        General: Tenderness present. No swelling.     Cervical back: Neck supple.     Comments: Patient has some tenderness to the fibular head on the left but there is no major swelling of the left knee or erythema or warmth  Skin:    General: Skin is warm and dry.     Capillary Refill: Capillary refill takes less than 2 seconds.     Findings: No erythema.  Neurological:     General: No focal deficit present.     Mental Status: She is alert.  Psychiatric:        Mood and Affect: Mood normal.     ED Results / Procedures / Treatments   Labs (all labs ordered are listed, but only  abnormal results are displayed) Labs Reviewed  I-STAT BETA HCG BLOOD, ED ([facility], [facility], [facility] ONLY)    EKG None  Radiology VAS [place] LOWER EXTREMITY VENOUS (DVT)  Result Date: 10/12/2022  Lower Venous DVT Study Patient Name:  Ariel Good  Date of Exam:   10/12/2022 Medical Rec #: [id number]     Accession #:    [id number] Date of Birth: [birth year]     Patient Gender: F Patient Age:   89 years Exam Location:  [facility] Procedure:      VAS [place] LOWER EXTREMITY VENOUS (DVT) Referring Phys: Aidon Klemens --------------------------------------------------------------------------------  Indications: Knee pain.  Limitations: Pain with compression at popliteal fossa. Comparison Study: No prior study on file Performing Technologist: Sherren Kerns RVS  Examination Guidelines: A complete evaluation includes B-mode imaging, spectral Doppler, color Doppler, and power Doppler as needed of all  accessible portions of each vessel. Bilateral testing is considered an integral part of a complete examination. Limited examinations for reoccurring indications may be performed as noted. The reflux portion of the exam is performed with the patient in reverse Trendelenburg.  +-----+---------------+---------+-----------+----------+--------------+ RIGHTCompressibilityPhasicitySpontaneityPropertiesThrombus Aging +-----+---------------+---------+-----------+----------+--------------+ CFV  Full           Yes      Yes                                 +-----+---------------+---------+-----------+----------+--------------+   +---------+---------------+---------+-----------+----------+-------------------+ LEFT     CompressibilityPhasicitySpontaneityPropertiesThrombus Aging      +---------+---------------+---------+-----------+----------+-------------------+ CFV      Full           Yes      Yes                                      +---------+---------------+---------+-----------+----------+-------------------+ SFJ      Full                                                             +---------+---------------+---------+-----------+----------+-------------------+ FV Prox  Full                                                             +---------+---------------+---------+-----------+----------+-------------------+ FV Mid   Full                                                             +---------+---------------+---------+-----------+----------+-------------------+ FV DistalFull                                                             +---------+---------------+---------+-----------+----------+-------------------+ PFV      Full                                                             +---------+---------------+---------+-----------+----------+-------------------+  POP                     Yes      Yes                  patent by color and                                                        Doppler             +---------+---------------+---------+-----------+----------+-------------------+ PTV      Full                                                             +---------+---------------+---------+-----------+----------+-------------------+ PERO     Full                                                             +---------+---------------+---------+-----------+----------+-------------------+ Gastroc                 Yes      Yes                  patent by color and                                                       Doppler             +---------+---------------+---------+-----------+----------+-------------------+    Summary: RIGHT: - No evidence of common femoral vein obstruction.  LEFT: - There is no evidence of deep vein thrombosis in the lower extremity.  - No cystic structure found in the popliteal fossa.  *See table(s) above for measurements and observations.    Preliminary    DG Knee Complete 4 Views Left  Result Date: 10/12/2022 CLINICAL DATA:  Worsening pain.  Fall 09/25/2022 EXAM: LEFT KNEE - COMPLETE 4+ VIEW COMPARISON:  Left knee radiographs 09/25/2022 (multiple studies). FINDINGS: Note is made of prior femorotibial dislocation on 09/25/2022 radiographs with subsequent reduction. There are again curvilinear lucencies within the proximal fibula, minimally displaced acute to subacute fracture lines. Mild-to-moderate joint effusion, new from prior. Minimal chronic enthesopathic change at the quadriceps insertion on the patella. IMPRESSION: 1. Note is made of prior femorotibial dislocation on 09/25/2022 radiographs with subsequent reduction. 2. Minimally displaced acute to subacute fracture within the proximal fibula, similar to prior. Electronically Signed   By: Neita Garnet M.D.   On: 10/12/2022 18:42    Procedures Procedures    Medications Ordered in ED Medications  traMADol (ULTRAM) tablet 50 mg (50  mg Oral Given 10/12/22 1837)    ED Course/ Medical Decision Making/ A&P  Medical Decision Making Risk Prescription drug management.   Jaclyn Primeracy Derhammer is here with ongoing left knee pain.  She was diagnosed with acute on chronic fracture to her proximal fibula recently.  She has been nonweightbearing with knee immobilizer.  She has history of diabetes.  She is having ongoing pain but denies any new trauma.  She is neurovascular neuromuscular intact on exam.  On exam of her knee there is no major swelling.  I do not see any obvious warmth erythema.  Patient has no fever.  I have no concern for septic joint.  I have no concern for peripheral arterial process.  I have no concern for cellulitis.  Repeat x-ray did not show any new fractures.  I also ordered a DVT study which is negative for clot.  Overall we will prescribe tramadol for further pain control.  Encouraged her to follow-up with orthopedics.  She does have an MRI scheduled outpatient.  Patient did request for MRI to be done in the ED but clinically I do not believe that is indicated at this time.  Ultimately I have provided her tramadol in the ED and will prescribe the same and have her follow-up with orthopedics.  Patient discharged in good condition.  This chart was dictated using voice recognition software.  Despite best efforts to proofread,  errors can occur which can change the documentation meaning.         Final Clinical Impression(s) / ED Diagnoses Final diagnoses:  Closed fracture of proximal end of left fibula with routine healing, unspecified fracture morphology, subsequent encounter    Rx / DC Orders ED Discharge Orders          Ordered    VAS US LOWER EXTREMITY VENOUS (DVT)  Status:  Canceled        10/12/22 1814    traMADol (ULTRAM) 50 MG tablet  Every 6 hours PRN        10/12/22 1924              Virgina NorfolkCuratolo, Corinne Goucher, DO 10/12/22 1925

## 2022-10-12 NOTE — ED Triage Notes (Signed)
Patient here with request for pain medication for left knee pain. Patient has known fracture in left fibula, seen by orthopedist on 12/21. Patient states she contacted her orthopedist today and was told to go to the ED. Patient is alert, oriented, and in no apparent distress at this time.

## 2022-10-12 NOTE — Progress Notes (Signed)
VASCULAR LAB    Left lower extremity venous duplex has been performed.  See CV proc for preliminary results.  Messaged negative results to Dr. Lockie Mola  via secure text  Sherren Kerns, RVT 10/12/2022, 6:54 PM

## 2022-10-12 NOTE — ED Provider Triage Note (Signed)
Emergency Medicine Provider Triage Evaluation Note  Ariel Good , a 41 y.o. female  was evaluated in triage.  Pt complains of worsening left knee pain for the past week.  Patient ports that she originally injured it on December 13.  She was seen at her orthopedic office on the 21st.  She reports that she is been having worsening pain for the past week.  She tried Tylenol and ibuprofen without much relief.  Unknown fevers.  Patient reports that she was sent in here by orthopedic because "it might be infected".  She reports that there was no overlying wound to the area to start off with that.   Review of Systems  Positive:  Negative:   Physical Exam  There were no vitals taken for this visit. Gen:   Awake, no distress   Resp:  Normal effort  MSK:   Moves extremities without difficulty  Other:  Knee immobilizer on the left in place.  Patient is able to wiggle toes.  Palpable pulses.  Compartments are soft.  Sensation intact.  Unable to do a complete exam given the patient's knee immobilizer and in the triage setting.  She will be roomed promptly.  Medical Decision Making  Medically screening exam initiated at 5:43 PM.  Appropriate orders placed.  Ariel Good was informed that the remainder of the evaluation will be completed by another provider, this initial triage assessment does not replace that evaluation, and the importance of remaining in the ED until their evaluation is complete.  XR and labs ordered   Ariel Good, [place] 10/12/22 1745

## 2022-10-17 ENCOUNTER — Other Ambulatory Visit: Payer: Self-pay

## 2022-10-17 ENCOUNTER — Encounter (HOSPITAL_COMMUNITY): Payer: Self-pay | Admitting: Emergency Medicine

## 2022-10-17 ENCOUNTER — Emergency Department (HOSPITAL_COMMUNITY): Payer: Medicare Other

## 2022-10-17 ENCOUNTER — Emergency Department (HOSPITAL_COMMUNITY)
Admission: EM | Admit: 2022-10-17 | Discharge: 2022-10-17 | Disposition: A | Discharge status: Home / Self Care | Payer: Medicare Other | Attending: Emergency Medicine | Admitting: Emergency Medicine

## 2022-10-17 DIAGNOSIS — M2392 Unspecified internal derangement of left knee: Secondary | ICD-10-CM | POA: Insufficient documentation

## 2022-10-17 DIAGNOSIS — Z7984 Long term (current) use of oral hypoglycemic drugs: Secondary | ICD-10-CM | POA: Insufficient documentation

## 2022-10-17 DIAGNOSIS — I10 Essential (primary) hypertension: Secondary | ICD-10-CM | POA: Insufficient documentation

## 2022-10-17 DIAGNOSIS — Z79899 Other long term (current) drug therapy: Secondary | ICD-10-CM | POA: Diagnosis not present

## 2022-10-17 DIAGNOSIS — E119 Type 2 diabetes mellitus without complications: Secondary | ICD-10-CM | POA: Diagnosis not present

## 2022-10-17 LAB — BASIC METABOLIC PANEL
Anion gap: 12 (ref 5–15)
BUN: 7 mg/dL (ref 6–20)
CO2: 24 mmol/L (ref 22–32)
Calcium: 9.1 mg/dL (ref 8.9–10.3)
Chloride: 100 mmol/L (ref 98–111)
Creatinine, Ser: 0.74 mg/dL (ref 0.44–1.00)
GFR, Estimated: >60 mL/min (ref >60)
Glucose, Bld: 142 mg/dL — ABNORMAL HIGH (ref 70–99)
Potassium: 3.6 mmol/L (ref 3.5–5.1)
Sodium: 136 mmol/L (ref 135–145)

## 2022-10-17 LAB — TROPONIN I (HIGH SENSITIVITY)
Troponin I (High Sensitivity): 7 ng/L (ref <18)
Troponin I (High Sensitivity): 8 ng/L (ref <18)

## 2022-10-17 LAB — CBC
HCT: 42.2 % (ref 36.0–46.0)
Hemoglobin: 13.3 g/dL (ref 12.0–15.0)
MCH: 29 pg (ref 26.0–34.0)
MCHC: 31.5 g/dL (ref 30.0–36.0)
MCV: 91.9 fL (ref 80.0–100.0)
Platelets: 331 10*3/uL (ref 150–400)
RBC: 4.59 MIL/uL (ref 3.87–5.11)
RDW: 14 % (ref 11.5–15.5)
WBC: 7.1 10*3/uL (ref 4.0–10.5)
nRBC: 0 % (ref 0.0–0.2)

## 2022-10-17 MED ORDER — IBUPROFEN 800 MG PO TABS: 800.0000 mg | ORAL_TABLET | Freq: Three times a day (TID) | ORAL | 0 refills | Status: AC

## 2022-10-17 MED ORDER — KETOROLAC TROMETHAMINE 30 MG/ML IJ SOLN
30.0000 mg | Freq: Once | INTRAMUSCULAR | Status: AC
  Administered 2022-10-17: 30 mg via INTRAVENOUS
  Filled 2022-10-17: qty 1

## 2022-10-17 MED ORDER — HYDROXYZINE HCL 25 MG PO TABS: 25.0000 mg | ORAL_TABLET | Freq: Three times a day (TID) | ORAL | 0 refills | Status: AC | PRN

## 2022-10-17 MED ORDER — LORAZEPAM 2 MG/ML IJ SOLN
0.5000 mg | Freq: Once | INTRAMUSCULAR | Status: AC
  Administered 2022-10-17: 0.5 mg via INTRAVENOUS
  Filled 2022-10-17: qty 1

## 2022-10-17 MED ORDER — LORAZEPAM 0.5 MG PO TABS: 0.5000 mg | ORAL_TABLET | Freq: Three times a day (TID) | ORAL | 0 refills | Status: AC | PRN

## 2022-10-17 NOTE — ED Notes (Signed)
Pt transported to MRI 

## 2022-10-17 NOTE — ED Triage Notes (Signed)
Pt reports hypertension and left knee pain. Pt reports she has been taking her BP medication like normal.

## 2022-10-17 NOTE — ED Provider Triage Note (Signed)
Emergency Medicine Provider Triage Evaluation Note  Ariel Good , a 42 y.o. female  was evaluated in triage.  Pt complains of elevated blood pressure onset last night. Notes that she hasn't been able to take all of her antihypertensives due to the tramadol that she is taking and potential interactions.  Notes that her blood pressure at his highest was 967 systolically last night.  Patient notes that she has had increased pain due to her left knee.  Per patient chart review patient is scheduled for an MRI of her left knee tomorrow as per her orthopedist.  Patient has associated sternal chest pain.  Denies shortness of breath, nausea, vomiting.   Review of Systems  Positive:  Negative:   Physical Exam  BP (!) 152/103   Pulse 97   Temp 98.8 F (37.1 C) (Oral)   Resp 18   SpO2 100%  Gen:   Awake, no distress   Resp:  Normal effort  MSK:   Moves extremities without difficulty  Other:  Left chest wall TTP  Medical Decision Making  Medically screening exam initiated at 4:33 PM.  Appropriate orders placed.  Ariel Good was informed that the remainder of the evaluation will be completed by another provider, this initial triage assessment does not replace that evaluation, and the importance of remaining in the ED until their evaluation is complete.     Ariel Good A, PA-C 10/17/22 1644

## 2022-10-17 NOTE — ED Provider Notes (Signed)
[facility] EMERGENCY DEPARTMENT Provider Note   CSN: [id number] Arrival date & time: 10/17/22  1554     History  Chief Complaint  Patient presents with   Hypertension    Ronnesha Mester is a 42 y.o. female.  Pt is a 42 yo female with a pmhx significant for dm, htn, fibromyalgia, ptsd, and anxiety.  On 12/13, pt sustained a femorotibial dislocation and a fx of the proximal fibula.  Pt has seen [facility] in [facility] and has been wearing a knee brace.  Pt said she was given oxy which caused itching.  She was then given ultram which also caused itching.  She's been taking ibuprofen every 2-3 hours for pain.  Someone told her she was not allowed to take her amlodipine if she took pain meds, so she's not been taking it.  Today, her bp was elevated.  She said she called her pcp who told her to go to the hospital.  Pt said her left knee has been hurting a lot.  Pt has a MRI ordered for tomorrow and wants to know if we can do it tonight.  Pt is also extremely anxious.  She has not been sleeping.  She was afraid to take her ativan with the pain medicine, so she has not been taking that either.         Home Medications Prior to Admission medications   Medication Sig Start Date End Date Taking? Authorizing Provider  hydrOXYzine (ATARAX) 25 MG tablet Take 1 tablet (25 mg total) by mouth every 8 (eight) hours as needed for anxiety. 10/17/22  Yes Jacalyn Lefevre, MD  ibuprofen (ADVIL) 800 MG tablet Take 1 tablet (800 mg total) by mouth 3 (three) times daily. 10/17/22  Yes Jacalyn Lefevre, MD  LORazepam (ATIVAN) 0.5 MG tablet Take 1 tablet (0.5 mg total) by mouth 3 (three) times daily as needed for anxiety. 10/17/22  Yes Jacalyn Lefevre, MD  amLODipine (NORVASC) 5 MG tablet Take 5 mg by mouth daily.    [provider]  lisinopril-hydrochlorothiazide (PRINZIDE,ZESTORETIC) 10-12.5 MG tablet Take 1 tablet by mouth daily.    [provider]  metFORMIN (GLUCOPHAGE) 500 MG  tablet Take 2,000 mg by mouth 2 (two) times daily with a meal.    [provider]  traMADol (ULTRAM) 50 MG tablet Take 1 tablet (50 mg total) by mouth every 6 (six) hours as needed. 10/12/22   Curatolo, Adam, DO      Allergies    Penicillins, Sulfa antibiotics, and Tape    Review of Systems   Review of Systems  Musculoskeletal:        Left knee pain  Psychiatric/Behavioral:  The patient is nervous/anxious.   All other systems reviewed and are negative.   Physical Exam Updated Vital Signs BP (!) 138/96 (BP Location: Left Arm)   Pulse 98   Temp 98.6 F (37 C) (Oral)   Resp 16   SpO2 100%  Physical Exam Vitals and nursing note reviewed.  Constitutional:      Appearance: Normal appearance. She is obese.  HENT:     Head: Normocephalic and atraumatic.     Right Ear: External ear normal.     Left Ear: External ear normal.     Nose: Nose normal.     Mouth/Throat:     Mouth: Mucous membranes are moist.     Pharynx: Oropharynx is clear.  Eyes:     Extraocular Movements: Extraocular movements intact.  Conjunctiva/sclera: Conjunctivae normal.     Pupils: Pupils are equal, round, and reactive to light.  Cardiovascular:     Rate and Rhythm: Normal rate and regular rhythm.     Pulses: Normal pulses.     Heart sounds: Normal heart sounds.  Pulmonary:     Effort: Pulmonary effort is normal.     Breath sounds: Normal breath sounds.  Abdominal:     General: Abdomen is flat. Bowel sounds are normal.     Palpations: Abdomen is soft.  Musculoskeletal:     Cervical back: Normal range of motion and neck supple.       Legs:     Comments: In knee brace  Skin:    General: Skin is warm.     Capillary Refill: Capillary refill takes less than 2 seconds.  Neurological:     General: No focal deficit present.     Mental Status: She is alert and oriented to person, place, and time.  Psychiatric:        Mood and Affect: Mood normal.        Behavior: Behavior normal.     ED  Results / Procedures / Treatments   Labs (all labs ordered are listed, but only abnormal results are displayed) Labs Reviewed  BASIC METABOLIC PANEL - Abnormal; Notable for the following components:      Result Value   Glucose, Bld 142 (*)    All other components within normal limits  CBC  URINALYSIS, ROUTINE W REFLEX MICROSCOPIC  TROPONIN I (HIGH SENSITIVITY)  TROPONIN I (HIGH SENSITIVITY)    EKG None  Radiology MR KNEE LEFT WO CONTRAST  Result Date: 10/17/2022 CLINICAL DATA:  Knee trauma, internal derangement suspected. EXAM: MRI OF THE LEFT KNEE WITHOUT CONTRAST TECHNIQUE: Multiplanar, multisequence MR imaging of the left knee was performed. No intravenous contrast was administered. COMPARISON:  Radiographs dated October 12, 2022 FINDINGS: MENISCI Medial: Intact. Lateral: Intact. LIGAMENTS Cruciates: Full-thickness tear of the anterior and posterior cruciate ligaments. Collaterals: Edema about the medial collateral ligament suggesting ligamentous strain/partial-thickness tear. Partial-thickness tear of the lateral collateral ligament with surrounding edema. The biceps tendons appear intact. The iliotibial tract is also appear intact. Small fluid collection along the posterior aspect of the biceps tendon. CARTILAGE Patellofemoral:  No chondral defect. Medial:  No chondral defect. Lateral:  No chondral defect. JOINT: Large joint effusion. Normal Hoffa's fat-pad. No plical thickening. POPLITEAL FOSSA: Edema about the popliteus tendon suggesting partial-thickness tear. No Baker's cyst. EXTENSOR MECHANISM: Intact quadriceps tendon. Intact patellar tendon. Intact lateral patellar retinaculum. Intact medial patellar retinaculum. Intact MPFL. BONES: Bone marrow edema about the tibial spine concerning for mildly displaced fracture. There is also bone contusion of the posterior aspect of the medial tibial plateau as well as bone contusion of the anterior aspect of the medial femoral condyle. There is  bone marrow edema about the tibiofibular joint with associated joint effusion, suggesting fracture dislocation. No aggressive osseous lesion. Other: No fluid collection or hematoma. Muscular edema about the medial and lateral head of the gastrocnemius, suggesting myotendinous injury in the setting of recent femorotibial dislocation. IMPRESSION: 1. Full-thickness tear of the anterior and posterior cruciate ligaments. 2. Partial-thickness tear of the lateral collateral ligament. Partial-thickness tear of the popliteus tendon. The findings are concerning for posterolateral corner injury. 3. Sprain/partial-thickness tear of the medial collateral ligament. 4. Bone marrow edema about the tibial spine concerning for mildly displaced fracture. 5. Bone contusion of the posterior aspect of the medial tibial plateau and anterior aspect  of the medial femoral condyle, suggesting bone contusions in the settings of femorotibial dislocation. 6. Bone marrow edema of the fibular head and lateral tibial plateau suggesting mildly displaced fracture/proximal tibiofibular joint injury. 7. Muscular edema about the medial and lateral head of the gastrocnemius, suggesting myotendinous injury in the setting of recent femorotibial dislocation. 8. Large knee joint effusion. Electronically Signed   By: Keane Police D.O.   On: 10/17/2022 22:22   DG Chest 1 View  Result Date: 10/17/2022 CLINICAL DATA:  Chest pain anteriorly EXAM: CHEST  1 VIEW COMPARISON:  01/16/2022 FINDINGS: The heart size and mediastinal contours are within normal limits. Both lungs are clear. The visualized skeletal structures are unremarkable. IMPRESSION: No active disease. Electronically Signed   By: Van Clines M.D.   On: 10/17/2022 17:29    Procedures Procedures    Medications Ordered in ED Medications  ketorolac (TORADOL) 30 MG/ML injection 30 mg (30 mg Intravenous Given 10/17/22 2022)  LORazepam (ATIVAN) injection 0.5 mg (0.5 mg Intravenous Given 10/17/22  2022)    ED Course/ Medical Decision Making/ A&P                           Medical Decision Making Amount and/or Complexity of Data Reviewed Radiology: ordered.  Risk Prescription drug management.   This patient presents to the ED for concern of htn, this involves an extensive number of treatment options, and is a complaint that carries with it a high risk of complications and morbidity.  The differential diagnosis includes noncompliance, pain, anxiety   Co morbidities that complicate the patient evaluation  dm, htn, fibromyalgia, ptsd, and anxiety   Additional history obtained:  Additional history obtained from epic chart review External records from outside source obtained and reviewed including family   Lab Tests:  I Ordered, and personally interpreted labs.  The pertinent results include:  cbc nl, bmp nl, trop nl   Imaging Studies ordered:  I ordered imaging studies including cxr and knee mri I independently visualized and interpreted imaging which showed  CXR:  nl MRI knee: 1. Full-thickness tear of the anterior and posterior cruciate  ligaments.  2. Partial-thickness tear of the lateral collateral ligament.  Partial-thickness tear of the popliteus tendon. The findings are  concerning for posterolateral corner injury.  3. Sprain/partial-thickness tear of the medial collateral ligament.  4. Bone marrow edema about the tibial spine concerning for mildly  displaced fracture.  5. Bone contusion of the posterior aspect of the medial tibial  plateau and anterior aspect of the medial femoral condyle,  suggesting bone contusions in the settings of femorotibial  dislocation.  6. Bone marrow edema of the fibular head and lateral tibial plateau  suggesting mildly displaced fracture/proximal tibiofibular joint  injury.  7. Muscular edema about the medial and lateral head of the  gastrocnemius, suggesting myotendinous injury in the setting of  recent femorotibial  dislocation.  8. Large knee joint effusion.   I agree with the radiologist interpretation   Cardiac Monitoring:  The patient was maintained on a cardiac monitor.  I personally viewed and interpreted the cardiac monitored which showed an underlying rhythm of: nsr   Medicines ordered and prescription drug management:  I ordered medication including ativan and toradol  for pain  Reevaluation of the patient after these medicines showed that the patient improved I have reviewed the patients home medicines and have made adjustments as needed   Test Considered:  Mri knee  Critical Interventions:  Pain control   Problem List / ED Course:  Htn:  better with pain control.  Pt told it is ok to take her usual bp meds. Knee injury:  multiple injuries noted on MRI.  Pt is established with ortho.  She is to f/u with ortho.     Reevaluation:  After the interventions noted above, I reevaluated the patient and found that they have :improved   Social Determinants of Health:  Lives at home   Dispostion:  After consideration of the diagnostic results and the patients response to treatment, I feel that the patent would benefit from discharge with outpatient f/u.          Final Clinical Impression(s) / ED Diagnoses Final diagnoses:  Hypertension, unspecified type  Internal derangement of left knee    Rx / DC Orders ED Discharge Orders          Ordered    ibuprofen (ADVIL) 800 MG tablet  3 times daily        10/17/22 2247    hydrOXYzine (ATARAX) 25 MG tablet  Every 8 hours PRN        10/17/22 2247    LORazepam (ATIVAN) 0.5 MG tablet  3 times daily PRN        10/17/22 2247              Jacalyn Lefevre, MD 10/17/22 2248
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
## 2023-12-15 ENCOUNTER — Emergency Department (HOSPITAL_COMMUNITY)
Admission: EM | Admit: 2023-12-15 | Discharge: 2023-12-15 | Disposition: A | Discharge status: Home / Self Care | Attending: Emergency Medicine | Admitting: Emergency Medicine

## 2023-12-15 ENCOUNTER — Encounter (HOSPITAL_COMMUNITY): Payer: Self-pay | Admitting: Emergency Medicine

## 2023-12-15 ENCOUNTER — Other Ambulatory Visit: Payer: Self-pay

## 2023-12-15 ENCOUNTER — Emergency Department (HOSPITAL_COMMUNITY)

## 2023-12-15 DIAGNOSIS — Z7984 Long term (current) use of oral hypoglycemic drugs: Secondary | ICD-10-CM | POA: Insufficient documentation

## 2023-12-15 DIAGNOSIS — I10 Essential (primary) hypertension: Secondary | ICD-10-CM | POA: Diagnosis not present

## 2023-12-15 DIAGNOSIS — R110 Nausea: Secondary | ICD-10-CM | POA: Insufficient documentation

## 2023-12-15 DIAGNOSIS — R519 Headache, unspecified: Secondary | ICD-10-CM | POA: Insufficient documentation

## 2023-12-15 DIAGNOSIS — E119 Type 2 diabetes mellitus without complications: Secondary | ICD-10-CM | POA: Insufficient documentation

## 2023-12-15 DIAGNOSIS — Z79899 Other long term (current) drug therapy: Secondary | ICD-10-CM | POA: Insufficient documentation

## 2023-12-15 MED ORDER — PROCHLORPERAZINE EDISYLATE 10 MG/2ML IJ SOLN
10.0000 mg | Freq: Once | INTRAMUSCULAR | Status: AC
  Administered 2023-12-15: 10 mg via INTRAVENOUS
  Filled 2023-12-15: qty 2

## 2023-12-15 MED ORDER — DIPHENHYDRAMINE HCL 50 MG/ML IJ SOLN
12.5000 mg | Freq: Once | INTRAMUSCULAR | Status: AC
  Administered 2023-12-15: 12.5 mg via INTRAVENOUS
  Filled 2023-12-15: qty 1

## 2023-12-15 NOTE — ED Notes (Signed)
 Pt given apple juice
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
## 2023-12-15 NOTE — Discharge Instructions (Signed)
 Continue your daily prescribed medications. Follow up with your primary care doctor. You may also follow up with your Neurologist, if desired.

## 2023-12-15 NOTE — ED Provider Notes (Signed)
[place] EMERGENCY DEPARTMENT AT [facility] Provider Note   CSN: [id number] Arrival date & time: 12/15/23  1713     History  Chief Complaint  Patient presents with   Headache    Ariel Good is a 43 y.o. female.  43 y/o female with PMH significant for FND w/attacks (PNES, stroke like sxs, and stuttering), migraine headaches, DM, HTN, IBS, gastritis/PUD, fibromyalgia, anemia, and arthritis presents to the ED for evaluation of headache. Reports onset of headache on Thursday secondary to "falling off the table" at an OSH when obtaining Xrays. She reports striking her head as a result of the fall, but denies LOC. She has been taking ibuprofen for headache without relief. Describes a constant pain which is global and aching, occasionally throbbing. She has associated nausea without vomiting. No fevers, new or worsening extremity numbness or paresthesias. Denies use of chronic anticoagulants.   She lives independently and ambulates/transitions without use of a rollator or wheelchair. She has an aide that comes daily in the midday; otherwise is able to function independently in her home in the AMs and evenings.  The history is provided by the patient. No language interpreter was used.  Headache      Home Medications Prior to Admission medications   Medication Sig Start Date End Date Taking? Authorizing Provider  amLODipine (NORVASC) 5 MG tablet Take 5 mg by mouth daily.    [provider]  hydrOXYzine (ATARAX) 25 MG tablet Take 1 tablet (25 mg total) by mouth every 8 (eight) hours as needed for anxiety. 10/17/22   Jacalyn Lefevre, MD  ibuprofen (ADVIL) 800 MG tablet Take 1 tablet (800 mg total) by mouth 3 (three) times daily. 10/17/22   Jacalyn Lefevre, MD  lisinopril-hydrochlorothiazide (PRINZIDE,ZESTORETIC) 10-12.5 MG tablet Take 1 tablet by mouth daily.    [provider]  LORazepam (ATIVAN) 0.5 MG tablet Take 1 tablet (0.5 mg total) by mouth 3 (three) times  daily as needed for anxiety. 10/17/22   Jacalyn Lefevre, MD  metFORMIN (GLUCOPHAGE) 500 MG tablet Take 2,000 mg by mouth 2 (two) times daily with a meal.    [provider]  traMADol (ULTRAM) 50 MG tablet Take 1 tablet (50 mg total) by mouth every 6 (six) hours as needed. 10/12/22   Curatolo, Adam, DO      Allergies    Penicillins, Sulfa antibiotics, and Tape    Review of Systems   Review of Systems  Neurological:  Positive for headaches.  Ten systems reviewed and are negative for acute change, except as noted in the HPI.    Physical Exam Updated Vital Signs BP 123/84 (BP Location: Left Arm)   Pulse 91   Temp 98.4 F (36.9 C) (Oral)   Resp 18   SpO2 100%   Physical Exam Vitals and nursing note reviewed.  Constitutional:      General: She is not in acute distress.    Appearance: She is well-developed. She is not diaphoretic.     Comments: Nontoxic appearing and in NAD  HENT:     Head: Normocephalic and atraumatic.     Comments: No battle's sign or raccoon's eyes. No skull instability or appreciable contusion.    Right Ear: External ear normal.     Left Ear: External ear normal.     Mouth/Throat:     Mouth: Mucous membranes are moist.  Eyes:     General: No scleral icterus.    Extraocular Movements: Extraocular movements intact.     Conjunctiva/sclera:  Conjunctivae normal.  Neck:     Comments: No meningismus  Cardiovascular:     Rate and Rhythm: Normal rate and regular rhythm.     Pulses: Normal pulses.  Pulmonary:     Effort: Pulmonary effort is normal. No respiratory distress.     Comments: Respirations even and unlabored. Lungs CTAB. Musculoskeletal:        General: Normal range of motion.     Cervical back: Normal range of motion.  Skin:    General: Skin is warm and dry.     Coloration: Skin is not pale.     Findings: No erythema or rash.  Neurological:     Mental Status: She is alert and oriented to person, place, and time.     Cranial Nerves: No  cranial nerve deficit.     Coordination: Coordination normal.     Comments: Alert, conversant. Speech is goal oriented. She does have a frequent stutter. She has no facial asymmetry or drooping. Eyebrow raise is symmetric. EOMs intact. Moves head and neck as well as upper extremities independently and against gravity while ascertaining history from the patient; however, when attempting to have the patient grip my hands bilaterally and assess muscle strength patient becomes quite apathetic with respect to exam making it challenging to assess for true neurologic deficit. Gait not assessed.  Psychiatric:        Behavior: Behavior normal.     ED Results / Procedures / Treatments   Labs (all labs ordered are listed, but only abnormal results are displayed) Labs Reviewed - No data to display  EKG None  Radiology CT Cervical Spine Wo Contrast Result Date: 12/15/2023 CLINICAL DATA:  Headache with midline neck tenderness. EXAM: CT CERVICAL SPINE WITHOUT CONTRAST TECHNIQUE: Multidetector CT imaging of the cervical spine was performed without intravenous contrast. Multiplanar CT image reconstructions were also generated. RADIATION DOSE REDUCTION: This exam was performed according to the departmental dose-optimization program which includes automated exposure control, adjustment of the mA and/or kV according to patient size and/or use of iterative reconstruction technique. COMPARISON:  November 19, 2021 FINDINGS: Alignment: There is straightening of the normal cervical spine lordosis. Skull base and vertebrae: No acute fracture. No primary bone lesion or focal pathologic process. Soft tissues and spinal canal: No prevertebral fluid or swelling. No visible canal hematoma. Disc levels: Normal multilevel endplates are seen with normal multilevel intervertebral disc spaces. Normal, bilateral multilevel facet joints are noted. Upper chest: Negative. Other: Multiple small bilateral posterior cervical chain lymph nodes  are seen. IMPRESSION: 1. No acute fracture or subluxation in the cervical spine. 2. Multiple small bilateral posterior cervical chain lymph nodes, likely reactive. Electronically Signed   By: Aram Candela M.D.   On: 12/15/2023 22:37   CT Head Wo Contrast Result Date: 12/15/2023 CLINICAL DATA:  Headache with midline neck tenderness. EXAM: CT HEAD WITHOUT CONTRAST TECHNIQUE: Contiguous axial images were obtained from the base of the skull through the vertex without intravenous contrast. RADIATION DOSE REDUCTION: This exam was performed according to the departmental dose-optimization program which includes automated exposure control, adjustment of the mA and/or kV according to patient size and/or use of iterative reconstruction technique. COMPARISON:  August 25, 2023 FINDINGS: Brain: No evidence of acute infarction, hemorrhage, hydrocephalus, extra-axial collection or mass lesion/mass effect. Vascular: No hyperdense vessel or unexpected calcification. Skull: Normal. Negative for fracture or focal lesion. Sinuses/Orbits: No acute finding. Other: None. IMPRESSION: No acute intracranial pathology. Electronically Signed   By: Demetrius Revel.D.  On: 12/15/2023 22:35    Procedures Procedures    Medications Ordered in ED Medications  prochlorperazine (COMPAZINE) injection 10 mg (10 mg Intravenous Given 12/15/23 2301)  diphenhydrAMINE (BENADRYL) injection 12.5 mg (12.5 mg Intravenous Given 12/15/23 2301)    ED Course/ Medical Decision Making/ A&P Clinical Course as of 12/15/23 2348  Mon Dec 15, 2023  2346 Patient states that pain has eased up a bit. Declines additional medications. CT head and C-spine are reassuring. No signs of acute intracranial process, traumatic changes. Patient comfortable following with her PCP and Neurologist. [KH]    Clinical Course User Index [KH] Antony Madura, PA-C                                 Medical Decision Making Risk Prescription drug management.   This  patient presents to the ED for concern of headache, this involves an extensive number of treatment options, and is a complaint that carries with it a high risk of complications and morbidity.  The differential diagnosis includes migraine vs ICH vs skull fx vs concussion/post concussive syndrome.   Co morbidities that complicate the patient evaluation  FND Migraines DM IBS Fibromyalgia   Additional history obtained:  External records from outside source obtained and reviewed including psychiatry note from 12/02/23.   Imaging Studies ordered:  I ordered imaging studies including CT head and C-spine  I independently visualized and interpreted imaging which showed no acute intracranial process I agree with the radiologist interpretation   Cardiac Monitoring:  The patient was maintained on a cardiac monitor.  I personally viewed and interpreted the cardiac monitored which showed an underlying rhythm of: NSR   Medicines ordered and prescription drug management:  I ordered medication including compazine and benadryl for headache  Reevaluation of the patient after these medicines showed that the patient improved I have reviewed the patients home medicines and have made adjustments as needed   Test Considered:  CTA head/neck - felt low yield   Problem List / ED Course:  Patient presents to the emergency department for evaluation of headache which began 4 days ago after a fall. No associated LOC. No subsequent vomiting, new numbness or paresthesias.   Physical exam is challenging given hx of FND; however, no focal neurologic deficits were noted. Her CT head and C-spine imaging was negative for acute or traumatic process. On reassessment, the patient has had improvement in headache symptoms following a migraine cocktail.  Declines additional medications in the ED. I do not believe further emergent workup is indicated at this time.  Return precautions discussed and provided.  Patient  discharged in stable condition with no unaddressed concerns.   Reevaluation:  After the interventions noted above, I reevaluated the patient and found that they have :improved   Social Determinants of Health:  Lives independently Ambulatory with assist device   Dispostion:  After consideration of the diagnostic results and the patients response to treatment, I feel that the patent would benefit from adherence to prescribed medications with close PCP f/u. Return precautions discussed and provided. Patient discharged in stable condition with no unaddressed concerns.          Final Clinical Impression(s) / ED Diagnoses Final diagnoses:  Generalized headache    Rx / DC Orders ED Discharge Orders     None         Antony Madura, PA-C 12/15/23 2352    Anders Simmonds T, DO 12/16/23  2322  

## 2023-12-15 NOTE — ED Notes (Signed)
 Pt asking about medications for headache; informed no medications have been ordered and a provider will be to see her as soon as they can

## 2023-12-15 NOTE — ED Provider Triage Note (Signed)
 Emergency Medicine Provider Triage Evaluation Note  Ariel Good , a 43 y.o. female  was evaluated in triage.  Pt complains of headache, neck pain, nausea following fall and hitting head. Says she cannot ambulate.  Review of Systems  Positive:  Negative:   Physical Exam  BP (!) 122/90 (BP Location: Right Arm)   Pulse 93   Temp 97.6 F (36.4 C) (Oral)   Resp 18   SpO2 95%  Gen:   Awake, appears uncomfortable Resp:  Normal effort  MSK:   Does not move extremities Other:    Medical Decision Making  Medically screening exam initiated at 5:54 PM.  Appropriate orders placed.  Ariel Good was informed that the remainder of the evaluation will be completed by another provider, this initial triage assessment does not replace that evaluation, and the importance of remaining in the ED until their evaluation is complete.     Halford Decamp, PA-C 12/15/23 1755
